# Patient Record
Sex: Male | Born: 1995 | Race: White | Hispanic: No | Marital: Single | Smoking: Current every day smoker
Health system: Southern US, Community
[De-identification: ages and names within clinical notes are randomized; demographics above are authoritative.]

## PROBLEM LIST (undated history)

## (undated) HISTORY — PX: OTHER SURGICAL HISTORY: SHX169

---

## 2018-01-25 DIAGNOSIS — L259 Unspecified contact dermatitis, unspecified cause: Secondary | ICD-10-CM | POA: Diagnosis not present

## 2018-01-28 DIAGNOSIS — L259 Unspecified contact dermatitis, unspecified cause: Secondary | ICD-10-CM | POA: Diagnosis not present

## 2018-01-28 DIAGNOSIS — Z68.41 Body mass index (BMI) pediatric, 5th percentile to less than 85th percentile for age: Secondary | ICD-10-CM | POA: Diagnosis not present

## 2019-08-20 ENCOUNTER — Other Ambulatory Visit: Payer: Self-pay

## 2019-08-20 ENCOUNTER — Encounter (HOSPITAL_COMMUNITY): Payer: Self-pay | Admitting: Emergency Medicine

## 2019-08-20 ENCOUNTER — Emergency Department (HOSPITAL_COMMUNITY)
Admission: EM | Admit: 2019-08-20 | Discharge: 2019-08-20 | Disposition: A | Discharge status: Home / Self Care | Payer: Medicaid Other | Attending: Emergency Medicine | Admitting: Emergency Medicine

## 2019-08-20 ENCOUNTER — Emergency Department (HOSPITAL_COMMUNITY): Payer: Medicaid Other

## 2019-08-20 DIAGNOSIS — Y93I9 Activity, other involving external motion: Secondary | ICD-10-CM | POA: Diagnosis not present

## 2019-08-20 DIAGNOSIS — Y999 Unspecified external cause status: Secondary | ICD-10-CM | POA: Diagnosis not present

## 2019-08-20 DIAGNOSIS — F17290 Nicotine dependence, other tobacco product, uncomplicated: Secondary | ICD-10-CM | POA: Insufficient documentation

## 2019-08-20 DIAGNOSIS — Y92410 Unspecified street and highway as the place of occurrence of the external cause: Secondary | ICD-10-CM | POA: Diagnosis not present

## 2019-08-20 DIAGNOSIS — S339XXA Sprain of unspecified parts of lumbar spine and pelvis, initial encounter: Secondary | ICD-10-CM | POA: Insufficient documentation

## 2019-08-20 DIAGNOSIS — T148XXA Other injury of unspecified body region, initial encounter: Secondary | ICD-10-CM

## 2019-08-20 DIAGNOSIS — S3992XA Unspecified injury of lower back, initial encounter: Secondary | ICD-10-CM | POA: Diagnosis present

## 2019-08-20 MED ORDER — OXYCODONE-ACETAMINOPHEN 5-325 MG PO TABS
1.0000 | ORAL_TABLET | Freq: Once | ORAL | Status: AC
  Administered 2019-08-20: 1 via ORAL
  Filled 2019-08-20: qty 1

## 2019-08-20 MED ORDER — HYDROCODONE-ACETAMINOPHEN 5-325 MG PO TABS: 1.0000 | ORAL_TABLET | ORAL | 0 refills | Status: AC | PRN

## 2019-08-20 NOTE — ED Notes (Signed)
Pt transported to to xray.
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
## 2020-05-10 ENCOUNTER — Encounter (HOSPITAL_COMMUNITY): Payer: Self-pay | Admitting: Emergency Medicine

## 2020-05-10 ENCOUNTER — Other Ambulatory Visit: Payer: Self-pay

## 2020-05-10 ENCOUNTER — Emergency Department (HOSPITAL_COMMUNITY)
Admission: EM | Admit: 2020-05-10 | Discharge: 2020-05-10 | Disposition: A | Discharge status: Home / Self Care | Payer: Medicaid Other | Attending: Emergency Medicine | Admitting: Emergency Medicine

## 2020-05-10 DIAGNOSIS — M542 Cervicalgia: Secondary | ICD-10-CM | POA: Diagnosis present

## 2020-05-10 DIAGNOSIS — Z5321 Procedure and treatment not carried out due to patient leaving prior to being seen by health care provider: Secondary | ICD-10-CM | POA: Insufficient documentation

## 2020-05-10 NOTE — ED Notes (Signed)
Pt says he does not want to wait that long, he just wanted some pain medicine but is not waiting that long. Pt says he will go to chiropractor.

## 2020-05-10 NOTE — ED Triage Notes (Signed)
Pt reports moving furniture today when he felt pop in the back of his neck. Pt is now having neck pain.
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
